# Patient Record
Sex: Female | Born: 1945 | Race: White | Hispanic: No | Smoking: Former smoker
Health system: Southern US, Community
[De-identification: ages and names within clinical notes are randomized; demographics above are authoritative.]

## PROBLEM LIST (undated history)

## (undated) DIAGNOSIS — C801 Malignant (primary) neoplasm, unspecified: Secondary | ICD-10-CM

## (undated) HISTORY — PX: ABDOMINAL HYSTERECTOMY: SHX81

## (undated) HISTORY — PX: BREAST LUMPECTOMY: SHX2

## (undated) HISTORY — PX: CHOLECYSTECTOMY: SHX55

## (undated) HISTORY — PX: KNEE SURGERY: SHX244

---

## 2015-10-27 ENCOUNTER — Other Ambulatory Visit: Payer: Self-pay | Admitting: Internal Medicine

## 2015-10-27 DIAGNOSIS — N6320 Unspecified lump in the left breast, unspecified quadrant: Secondary | ICD-10-CM

## 2015-11-03 ENCOUNTER — Other Ambulatory Visit: Payer: Self-pay | Admitting: Internal Medicine

## 2015-11-03 ENCOUNTER — Ambulatory Visit
Admission: RE | Admit: 2015-11-03 | Discharge: 2015-11-03 | Disposition: A | Discharge status: Home / Self Care | Payer: Medicare Other | Source: Ambulatory Visit | Attending: Internal Medicine | Admitting: Internal Medicine

## 2015-11-03 DIAGNOSIS — N6320 Unspecified lump in the left breast, unspecified quadrant: Secondary | ICD-10-CM

## 2016-01-24 ENCOUNTER — Encounter (HOSPITAL_BASED_OUTPATIENT_CLINIC_OR_DEPARTMENT_OTHER): Payer: Self-pay | Admitting: *Deleted

## 2016-01-24 ENCOUNTER — Emergency Department (HOSPITAL_BASED_OUTPATIENT_CLINIC_OR_DEPARTMENT_OTHER)
Admission: EM | Admit: 2016-01-24 | Discharge: 2016-01-24 | Disposition: A | Discharge status: Home / Self Care | Payer: Medicare Other | Attending: Emergency Medicine | Admitting: Emergency Medicine

## 2016-01-24 DIAGNOSIS — L509 Urticaria, unspecified: Secondary | ICD-10-CM | POA: Diagnosis not present

## 2016-01-24 DIAGNOSIS — Z79899 Other long term (current) drug therapy: Secondary | ICD-10-CM | POA: Diagnosis not present

## 2016-01-24 DIAGNOSIS — L299 Pruritus, unspecified: Secondary | ICD-10-CM | POA: Diagnosis present

## 2016-01-24 DIAGNOSIS — Z87891 Personal history of nicotine dependence: Secondary | ICD-10-CM | POA: Diagnosis not present

## 2016-01-24 HISTORY — DX: Malignant (primary) neoplasm, unspecified: C80.1

## 2016-01-24 MED ORDER — LORATADINE 10 MG PO TABS
10.0000 mg | ORAL_TABLET | Freq: Every day | ORAL | Status: DC
  Administered 2016-01-24: 10 mg via ORAL
  Filled 2016-01-24: qty 1

## 2016-01-24 MED ORDER — PREDNISONE 10 MG PO TABS
60.0000 mg | ORAL_TABLET | Freq: Once | ORAL | Status: AC
  Administered 2016-01-24: 60 mg via ORAL
  Filled 2016-01-24: qty 1

## 2016-01-24 MED ORDER — PREDNISONE 20 MG PO TABS: 20.0000 mg | ORAL_TABLET | Freq: Every day | ORAL | Status: AC

## 2016-01-24 MED ORDER — HYDROXYZINE HCL 25 MG PO TABS: 25.0000 mg | ORAL_TABLET | Freq: Four times a day (QID) | ORAL | Status: AC

## 2016-01-24 MED ORDER — CETIRIZINE HCL 10 MG PO TABS: 10.0000 mg | ORAL_TABLET | Freq: Every day | ORAL | Status: AC

## 2016-01-24 MED ORDER — HYDROXYZINE HCL 25 MG PO TABS
25.0000 mg | ORAL_TABLET | Freq: Once | ORAL | Status: AC
  Administered 2016-01-24: 25 mg via ORAL
  Filled 2016-01-24: qty 1

## 2016-01-24 NOTE — ED Notes (Signed)
Patient c/o hives & itching that started yesterday afternoon on her right hand and has spread to her upper and lower body. She took benadryl around 2am.

## 2016-01-24 NOTE — ED Provider Notes (Signed)
CSN: [id number]     Arrival date & time 01/24/16  0806 History   First MD Initiated Contact with Patient 01/24/16 [phone]     Chief Complaint  Patient presents with  . Urticaria     HPI  Patient presents evaluation of "hives". Started feeling some itching on her hands and back of her neck yesterday noticed some erythema and elevation of the skin. This is now spread to under her breasts stomach and back. No difficult breathing. No difficulty swallowing or speech. Not dizzy or lightheaded. No GI complaints. No obvious antigen. Is on Cytoxan, and Taxol for breast cancer. Severe second and fusion 2 weeks ago. Her first infusion was 5 weeks ago and did not develop rash. No new medications or foods or garments for her. No history of urticaria.  Past Medical History  Diagnosis Date  . Cancer [facility])     breast   Past Surgical History  Procedure Laterality Date  . Cholecystectomy    . Knee surgery Bilateral   . Breast lumpectomy Left   . Abdominal hysterectomy     No family history on file. Social History  Substance Use Topics  . Smoking status: Former Research scientist (life sciences)  . Smokeless tobacco: None  . Alcohol Use: No   OB History    No data available     Review of Systems  Constitutional: Negative for fever, chills, diaphoresis, appetite change and fatigue.  HENT: Negative for mouth sores, sore throat and trouble swallowing.   Eyes: Negative for visual disturbance.  Respiratory: Negative for cough, chest tightness, shortness of breath and wheezing.   Cardiovascular: Negative for chest pain.  Gastrointestinal: Negative for nausea, vomiting, abdominal pain, diarrhea and abdominal distention.  Endocrine: Negative for polydipsia, polyphagia and polyuria.  Genitourinary: Negative for dysuria, frequency and hematuria.  Musculoskeletal: Negative for gait problem.  Skin: Positive for rash. Negative for color change and pallor.  Neurological: Negative for dizziness, syncope, light-headedness and  headaches.  Hematological: Does not bruise/bleed easily.  Psychiatric/Behavioral: Negative for behavioral problems and confusion.      Allergies  Review of patient's allergies indicates no known allergies.  Home Medications   Prior to Admission medications   Medication Sig Start Date End Date Taking? Authorizing Provider  colesevelam (WELCHOL) 625 MG tablet Take by mouth 2 (two) times daily with a meal.   Yes Historical Provider, MD  cetirizine (ZYRTEC) 10 MG tablet Take 1 tablet (10 mg total) by mouth daily. 1 po q day prn allergies 01/24/16   Tanna Furry, MD  hydrOXYzine (ATARAX/VISTARIL) 25 MG tablet Take 1 tablet (25 mg total) by mouth every 6 (six) hours. 01/24/16   Tanna Furry, MD  predniSONE (DELTASONE) 20 MG tablet Take 1 tablet (20 mg total) by mouth daily with breakfast. 01/24/16   Tanna Furry, MD   BP 149/97 mmHg  Pulse 106  Temp(Src) 98.8 F (37.1 C) (Oral)  Resp 18  Ht 5\' 2"  (1.575 m)  Wt 235 lb (106.595 kg)  BMI 42.97 kg/m2  SpO2 98% Physical Exam  Constitutional: She is oriented to person, place, and time. She appears well-developed and well-nourished. No distress.  HENT:  Head: Normocephalic.  Posterior drinks benign. Uvula midline. No stridor. Clear lungs.  Eyes: Conjunctivae are normal. Pupils are equal, round, and reactive to light. No scleral icterus.  Neck: Normal range of motion. Neck supple. No thyromegaly present.  Cardiovascular: Normal rate and regular rhythm.  Exam reveals no gallop and no friction rub.   No murmur heard. Pulmonary/Chest:  Effort normal and breath sounds normal. No respiratory distress. She has no wheezes. She has no rales.  Abdominal: Soft. Bowel sounds are normal. She exhibits no distension. There is no tenderness. There is no rebound.  Musculoskeletal: Normal range of motion.  Neurological: She is alert and oriented to person, place, and time.  Skin: Skin is warm and dry. Rash noted.  Psychiatric: She has a normal mood and affect. Her  behavior is normal.    ED Course  Procedures (including critical care time) Labs Review Labs Reviewed - No data to display  Imaging Review No results found. I have personally reviewed and evaluated these images and lab results as part of my medical decision-making.   EKG Interpretation None      MDM   Final diagnoses:  Urticaria    Dermatologic only reaction. No symptoms of GI reaction. Normal throat clear lungs no sensation of swelling in the throat or difficult breathing. Normotensive. Urticaria without anaphylaxis. No indication for epinephrine. Plan will be antihistamines prednisone. No clear known antigen from her history. Primary care follow-up.    Tanna Furry, MD 01/24/16 1019

## 2016-01-24 NOTE — ED Notes (Signed)
MD with pt  

## 2016-01-24 NOTE — Discharge Instructions (Signed)
Hives Hives are itchy, red, swollen areas of the skin. They can vary in size and location on your body. Hives can come and go for hours or several days (acute hives) or for several weeks (chronic hives). Hives do not spread from person to person (noncontagious). They may get worse with scratching, exercise, and emotional stress. CAUSES   Allergic reaction to food, additives, or drugs.  Infections, including the common cold.  Illness, such as vasculitis, lupus, or thyroid disease.  Exposure to sunlight, heat, or cold.  Exercise.  Stress.  Contact with chemicals. SYMPTOMS   Red or white swollen patches on the skin. The patches may change size, shape, and location quickly and repeatedly.  Itching.  Swelling of the hands, feet, and face. This may occur if hives develop deeper in the skin. DIAGNOSIS  Your caregiver can usually tell what is wrong by performing a physical exam. Skin or blood tests may also be done to determine the cause of your hives. In some cases, the cause cannot be determined. TREATMENT  Mild cases usually get better with medicines such as antihistamines. Severe cases may require an emergency epinephrine injection. If the cause of your hives is known, treatment includes avoiding that trigger.  HOME CARE INSTRUCTIONS   Avoid causes that trigger your hives.  Take antihistamines as directed by your caregiver to reduce the severity of your hives. Non-sedating or low-sedating antihistamines are usually recommended. Do not drive while taking an antihistamine.  Take any other medicines prescribed for itching as directed by your caregiver.  Wear loose-fitting clothing.  Keep all follow-up appointments as directed by your caregiver. SEEK MEDICAL CARE IF:   You have persistent or severe itching that is not relieved with medicine.  You have painful or swollen joints. SEEK IMMEDIATE MEDICAL CARE IF:   You have a fever.  Your tongue or lips are swollen.  You have  trouble breathing or swallowing.  You feel tightness in the throat or chest.  You have abdominal pain. These problems may be the first sign of a life-threatening allergic reaction. Call your local emergency services (911 in [place] MAKE SURE YOU:   Understand these instructions.  Will watch your condition.  Will get help right away if you are not doing well or get worse.   This information is not intended to replace advice given to you by your health care provider. Make sure you discuss any questions you have with your health care provider.   Document Released: 08/22/2005 Document Revised: 08/27/2013 Document Reviewed: 11/15/2011 Elsevier Interactive Patient Education 2016 [employer]  

## 2016-01-25 ENCOUNTER — Encounter (HOSPITAL_BASED_OUTPATIENT_CLINIC_OR_DEPARTMENT_OTHER): Payer: Self-pay | Admitting: *Deleted

## 2016-01-25 DIAGNOSIS — L509 Urticaria, unspecified: Secondary | ICD-10-CM | POA: Diagnosis not present

## 2016-01-25 DIAGNOSIS — Z853 Personal history of malignant neoplasm of breast: Secondary | ICD-10-CM | POA: Insufficient documentation

## 2016-01-25 DIAGNOSIS — Z87891 Personal history of nicotine dependence: Secondary | ICD-10-CM | POA: Insufficient documentation

## 2016-01-25 DIAGNOSIS — T7840XA Allergy, unspecified, initial encounter: Secondary | ICD-10-CM | POA: Diagnosis present

## 2016-01-25 NOTE — ED Notes (Signed)
Hives.  Was seen in ed last night for same.  States that the hives have gotten worse.  Denies respiratory involvement.

## 2016-01-26 ENCOUNTER — Emergency Department (HOSPITAL_BASED_OUTPATIENT_CLINIC_OR_DEPARTMENT_OTHER)
Admission: EM | Admit: 2016-01-26 | Discharge: 2016-01-26 | Disposition: A | Discharge status: Home / Self Care | Payer: Medicare Other | Attending: Emergency Medicine | Admitting: Emergency Medicine

## 2016-01-26 DIAGNOSIS — L509 Urticaria, unspecified: Secondary | ICD-10-CM

## 2016-01-26 MED ORDER — DIPHENHYDRAMINE HCL 50 MG/ML IJ SOLN
50.0000 mg | Freq: Once | INTRAMUSCULAR | Status: AC
  Administered 2016-01-26: 50 mg via INTRAVENOUS
  Filled 2016-01-26: qty 1

## 2016-01-26 MED ORDER — FAMOTIDINE IN NACL 20-0.9 MG/50ML-% IV SOLN
20.0000 mg | Freq: Once | INTRAVENOUS | Status: AC
  Administered 2016-01-26: 20 mg via INTRAVENOUS
  Filled 2016-01-26: qty 50

## 2016-01-26 MED ORDER — DIPHENHYDRAMINE HCL 50 MG/ML IJ SOLN
25.0000 mg | Freq: Once | INTRAMUSCULAR | Status: AC
  Administered 2016-01-26: 25 mg via INTRAVENOUS
  Filled 2016-01-26: qty 1

## 2016-01-26 NOTE — ED Provider Notes (Signed)
CSN: [id number]     Arrival date & time 01/25/16  2144 History   First MD Initiated Contact with Patient 01/26/16 0239     Chief Complaint  Patient presents with  . Allergic Reaction     (Consider location/radiation/quality/duration/timing/severity/associated sxs/prior Treatment) HPI  This is a 70 year old female undergoing chemotherapy for breast cancer. Her last treatment was 2 weeks ago. She was seen here yesterday for generalized urticaria. She was started on prednisone and hydroxyzine. She has been taking her medications as instructed but her urticaria has worsened. It is very pruritic. She denies throat swelling, difficulty breathing, nausea, vomiting or diarrhea. She thinks it may of been triggered by eating cherries.  Past Medical History  Diagnosis Date  . Cancer [facility])     breast   Past Surgical History  Procedure Laterality Date  . Cholecystectomy    . Knee surgery Bilateral   . Breast lumpectomy Left   . Abdominal hysterectomy     History reviewed. No pertinent family history. Social History  Substance Use Topics  . Smoking status: Former Research scientist (life sciences)  . Smokeless tobacco: None  . Alcohol Use: No   OB History    No data available     Review of Systems  All other systems reviewed and are negative.   Allergies  Review of patient's allergies indicates no known allergies.  Home Medications   Prior to Admission medications   Medication Sig Start Date End Date Taking? Authorizing Provider  cetirizine (ZYRTEC) 10 MG tablet Take 1 tablet (10 mg total) by mouth daily. 1 po q day prn allergies 01/24/16   Tanna Furry, MD  colesevelam [facility]) 625 MG tablet Take by mouth 2 (two) times daily with a meal.    Historical Provider, MD  hydrOXYzine (ATARAX/VISTARIL) 25 MG tablet Take 1 tablet (25 mg total) by mouth every 6 (six) hours. 01/24/16   Tanna Furry, MD  predniSONE (DELTASONE) 20 MG tablet Take 1 tablet (20 mg total) by mouth daily with breakfast. 01/24/16   Tanna Furry, MD    BP 150/82 mmHg  Pulse 121  Temp(Src) 98.9 F (37.2 C) (Oral)  Resp 18  Ht 5\' 2"  (1.575 m)  Wt 235 lb (106.595 kg)  BMI 42.97 kg/m2  SpO2 100%  LMP 01/25/2016   Physical Exam  General: Well-developed, well-nourished female in no acute distress; appearance consistent with age of record HENT: normocephalic; atraumatic; pharynx normal; no dysphonia Eyes: pupils equal, round and reactive to light; extraocular muscles intact Neck: supple Heart: regular rate and rhythm Lungs: clear to auscultation bilaterally Abdomen: soft; nondistended; nontender; bowel sounds present Extremities: No deformity; full range of motion; pulses normal Neurologic: Awake, alert and oriented; motor function intact in all extremities and symmetric; no facial droop Skin: Warm and dry; alopecia; generalized urticaria most prominent on the extremities Psychiatric: Normal mood and affect    ED Course  Procedures (including critical care time)   MDM  5:06 AM Patient feeling better, urticaria partially resolved after Benadryl 75 milligrams IV and Pepcid 20 milligrams IV. Patient is awake and alert. Patient was advised to try 75 milligrams of Benadryl every 6 hours as an alternative to hydroxyzine as it appears to have better results.  Shanon Rosser, MD 01/26/16 [phone]

## 2016-01-26 NOTE — ED Notes (Signed)
C/o whelps over body  Was seen here last pm for same  States meds not helping and it is getting worse,  No diff breathing or talking

## 2016-01-26 NOTE — Discharge Instructions (Signed)
Hives Hives are itchy, red, swollen areas of the skin. They can vary in size and location on your body. Hives can come and go for hours or several days (acute hives) or for several weeks (chronic hives). Hives do not spread from person to person (noncontagious). They may get worse with scratching, exercise, and emotional stress. CAUSES   Allergic reaction to food, additives, or drugs.  Infections, including the common cold.  Illness, such as vasculitis, lupus, or thyroid disease.  Exposure to sunlight, heat, or cold.  Exercise.  Stress.  Contact with chemicals. SYMPTOMS   Red or white swollen patches on the skin. The patches may change size, shape, and location quickly and repeatedly.  Itching.  Swelling of the hands, feet, and face. This may occur if hives develop deeper in the skin. DIAGNOSIS  Your caregiver can usually tell what is wrong by performing a physical exam. Skin or blood tests may also be done to determine the cause of your hives. In some cases, the cause cannot be determined. TREATMENT  Mild cases usually get better with medicines such as antihistamines. Severe cases may require an emergency epinephrine injection. If the cause of your hives is known, treatment includes avoiding that trigger.  HOME CARE INSTRUCTIONS   Avoid causes that trigger your hives.  Take antihistamines as directed by your caregiver to reduce the severity of your hives. Non-sedating or low-sedating antihistamines are usually recommended. Do not drive while taking an antihistamine.  Take any other medicines prescribed for itching as directed by your caregiver.  Wear loose-fitting clothing.  Keep all follow-up appointments as directed by your caregiver. SEEK MEDICAL CARE IF:   You have persistent or severe itching that is not relieved with medicine.  You have painful or swollen joints. SEEK IMMEDIATE MEDICAL CARE IF:   You have a fever.  Your tongue or lips are swollen.  You have  trouble breathing or swallowing.  You feel tightness in the throat or chest.  You have abdominal pain. These problems may be the first sign of a life-threatening allergic reaction. Call your local emergency services (911 in [place] MAKE SURE YOU:   Understand these instructions.  Will watch your condition.  Will get help right away if you are not doing well or get worse.   This information is not intended to replace advice given to you by your health care provider. Make sure you discuss any questions you have with your health care provider.   Document Released: 08/22/2005 Document Revised: 08/27/2013 Document Reviewed: 11/15/2011 Elsevier Interactive Patient Education 2016 [employer]  

## 2017-03-30 IMAGING — MG MM DIAG BREAST TOMO UNI LEFT
8 series · 8 of 24 positions shown · non-contrast
Comparison: Previous exam(s).

CLINICAL DATA: Biopsy was performed of 2 areas in the left breast,
of small mass is described on recent diagnostic mammogram and
ultrasound of 10/27/2015.

EXAM:
DIAGNOSTIC LEFT MAMMOGRAM POST STEREOTACTIC BIOPSY

[L CC (1 of 2)]
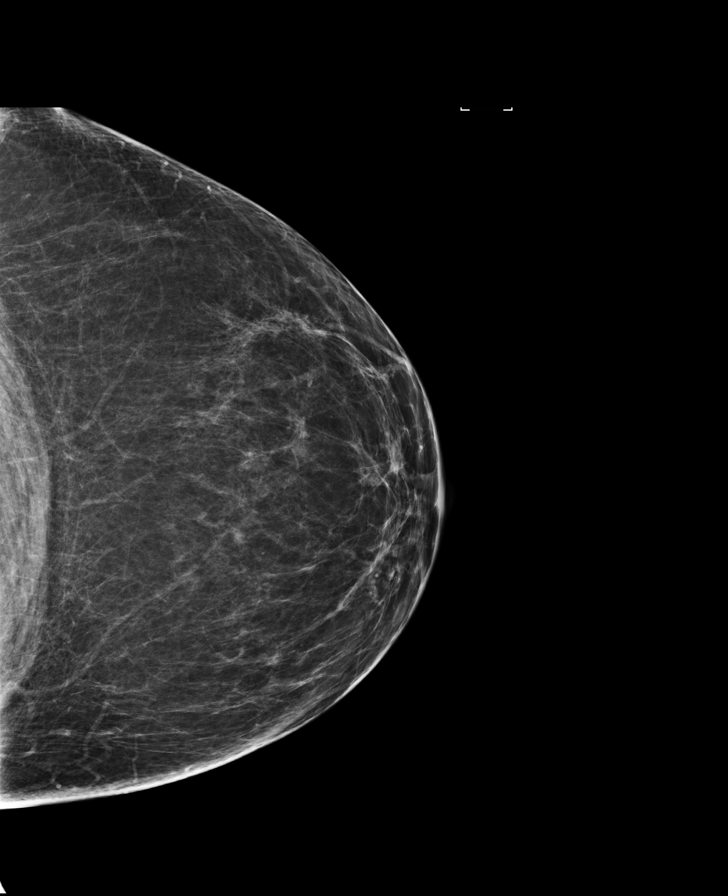

[L CC (2 of 2)]
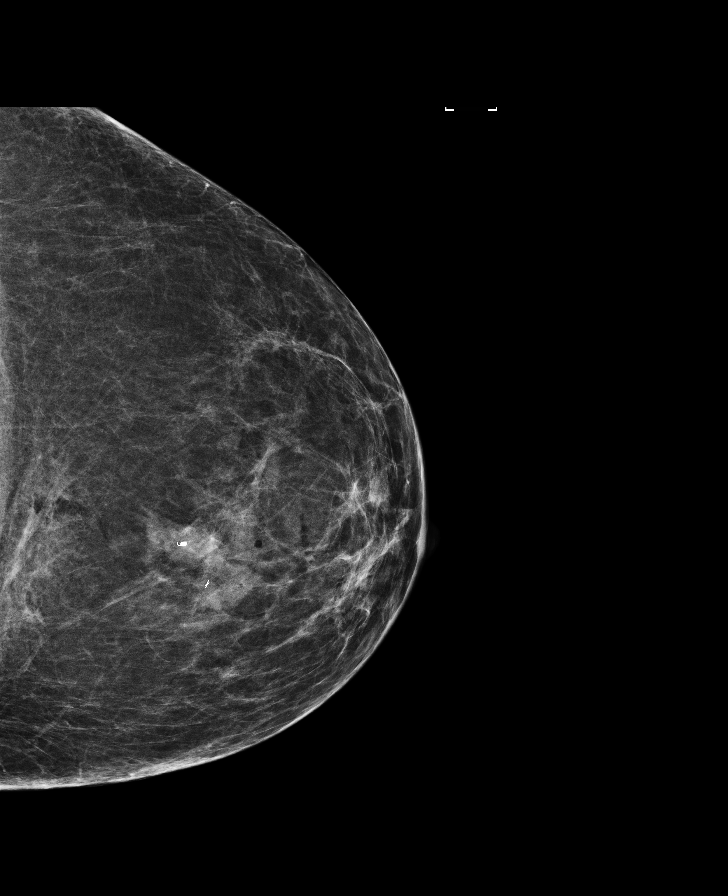

[L LM]
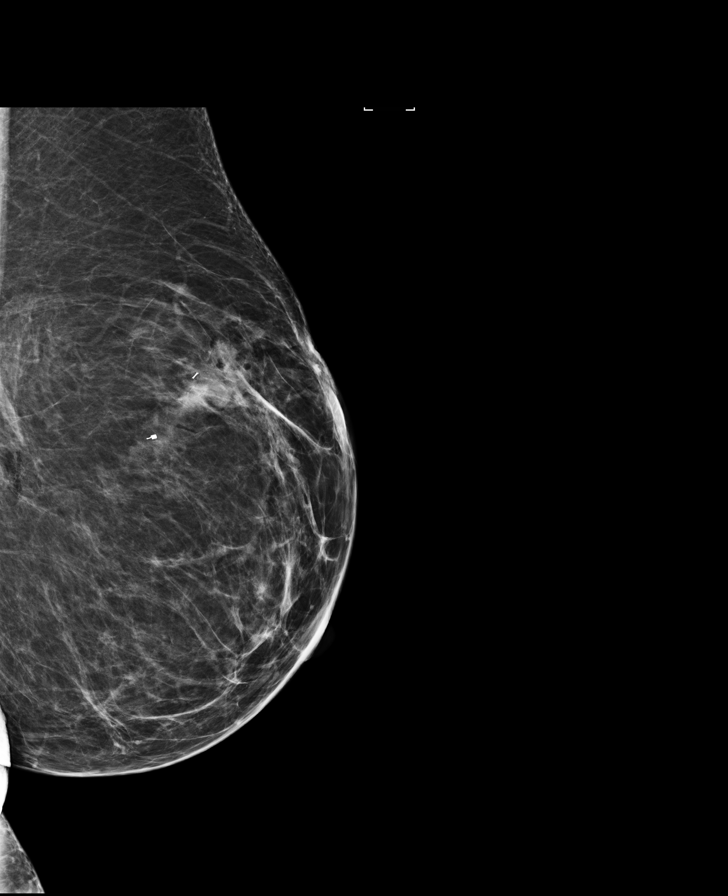

[L MLO]
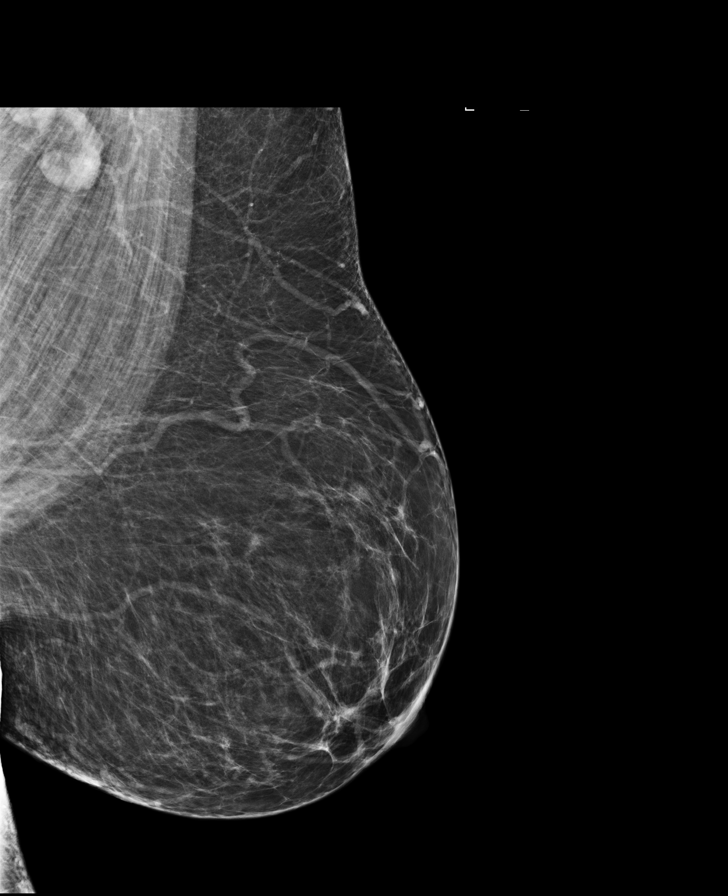

[L MLO tomo · tomo slice 49/97.0]
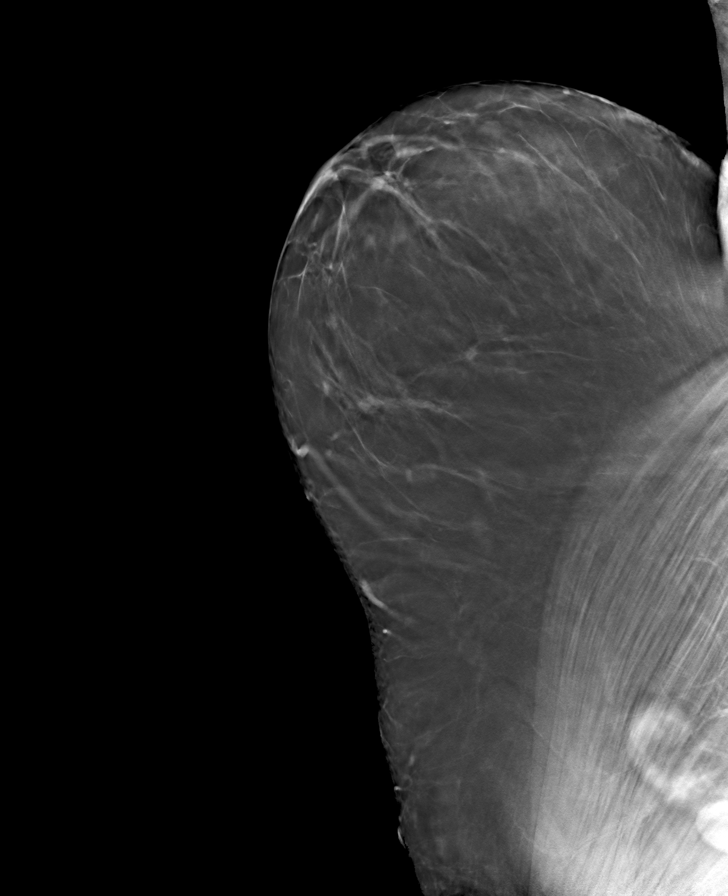

[L CC tomo (1 of 2) · tomo slice 43/84.0]
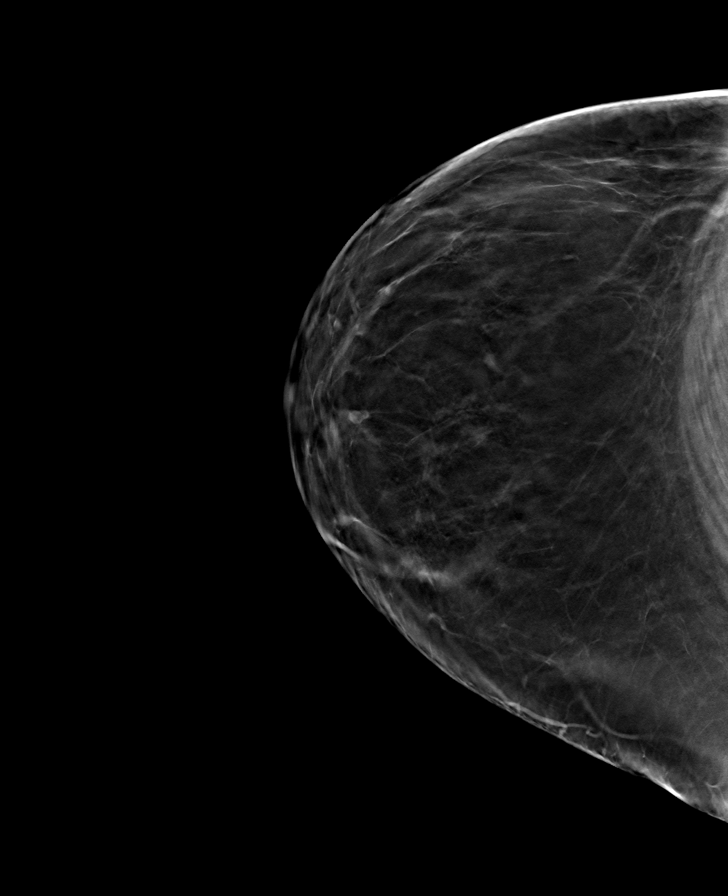

[L CC tomo (2 of 2) · tomo slice 43/86.0]
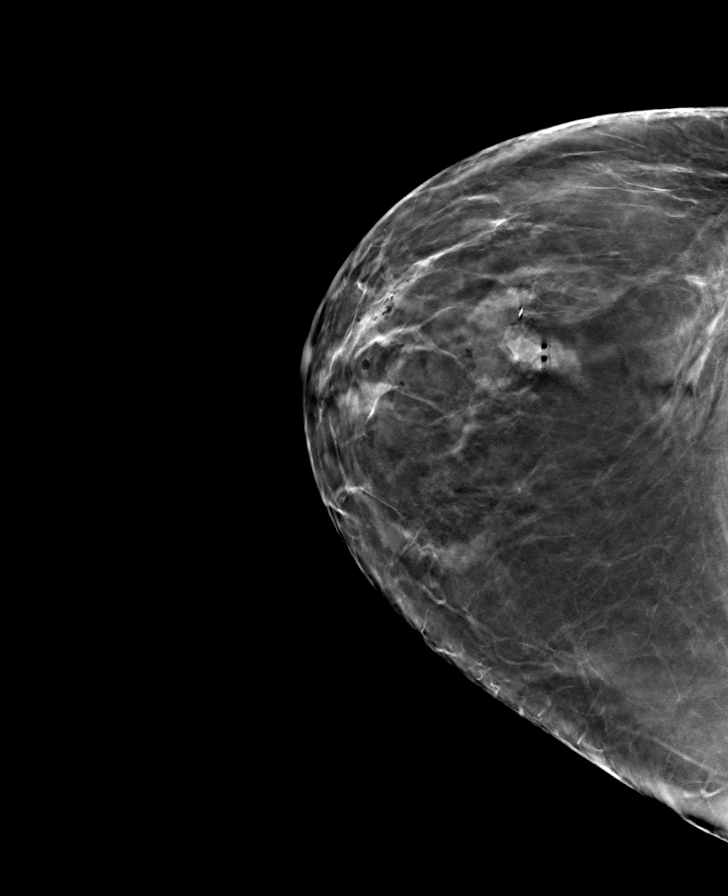

[L LM tomo · tomo slice 44/87.0]
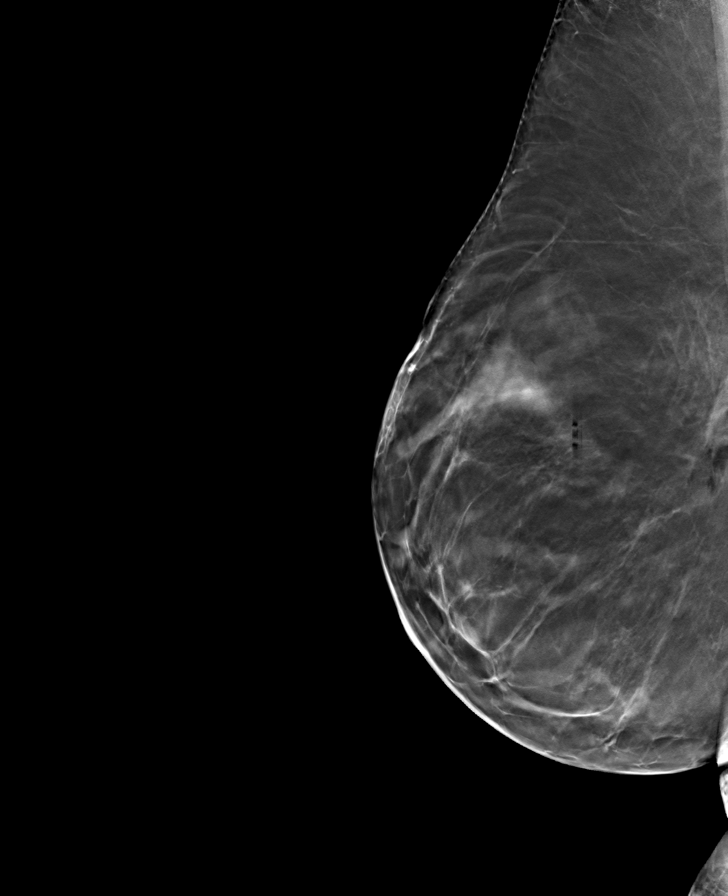

[8 of 24 positions shown; findings below may reference images not displayed]

FINDINGS: Mammographic images were obtained following stereotactic guided
biopsy of a small mass in the upper slightly inner left breast,
middle third. An coil shaped biopsy clip is satisfactorily
positioned in the upper central/slightly medial left breast. There
are biopsy changes at the site of the coil shaped biopsy clip,
making visualization of the small mass on the post clip mammograms
difficult. The biopsy clip is in the expected location of the
intended biopsy.

An X shaped biopsy clip was placed at the second stereotactic biopsy
site performed from a lateral to medial approach of a small mass in
the superior left breast, more anteriorly positioned than the first
biopsy mass. This mass was suspected to be in the 12 o'clock
position on the recent diagnostic workup. The X shaped biopsy clip
is in the expected location of the small mass on the 90 degree
lateral view, but appears slightly more medial in position than the
expected position of the mass on the recent diagnostic CC view. Due
to post biopsy changes and small hematoma, the small mass is not
well visualized. The biopsy clip may have migrated slightly
medially.
IMPRESSION: Satisfactory position of coil shaped biopsy clip in the expected
location of the [DATE] position mass.

X shaped biopsy clip appears appropriately positioned on the 90
degree lateral view, and may be migrated slightly medially on the CC
view.

Final Assessment: Post Procedure Mammograms for Marker Placement

## 2019-10-05 ENCOUNTER — Ambulatory Visit: Payer: Medicare Other

## 2019-10-16 ENCOUNTER — Ambulatory Visit: Payer: Medicare Other

## 2023-05-31 ENCOUNTER — Other Ambulatory Visit (HOSPITAL_BASED_OUTPATIENT_CLINIC_OR_DEPARTMENT_OTHER): Payer: Self-pay

## 2023-06-02 ENCOUNTER — Other Ambulatory Visit (HOSPITAL_BASED_OUTPATIENT_CLINIC_OR_DEPARTMENT_OTHER): Payer: Self-pay

## 2023-06-06 ENCOUNTER — Other Ambulatory Visit (HOSPITAL_BASED_OUTPATIENT_CLINIC_OR_DEPARTMENT_OTHER): Payer: Self-pay

## 2023-06-06 MED ORDER — WEGOVY 0.25 MG/0.5ML ~~LOC~~ SOAJ
0.2500 mg | SUBCUTANEOUS | 5 refills | Status: AC
  Filled 2023-06-06: qty 2, 28d supply, fill #0
  Filled 2023-06-19: qty 2, 28d supply, fill #1

## 2023-06-19 ENCOUNTER — Other Ambulatory Visit (HOSPITAL_BASED_OUTPATIENT_CLINIC_OR_DEPARTMENT_OTHER): Payer: Self-pay

## 2023-06-19 MED ORDER — WEGOVY 0.5 MG/0.5ML ~~LOC~~ SOAJ
0.5000 mg | SUBCUTANEOUS | 1 refills | Status: DC
  Filled 2023-06-19: qty 2, 28d supply, fill #0
  Filled 2023-07-24: qty 2, 28d supply, fill #1

## 2023-07-24 ENCOUNTER — Other Ambulatory Visit (HOSPITAL_BASED_OUTPATIENT_CLINIC_OR_DEPARTMENT_OTHER): Payer: Self-pay

## 2023-08-10 ENCOUNTER — Other Ambulatory Visit (HOSPITAL_BASED_OUTPATIENT_CLINIC_OR_DEPARTMENT_OTHER): Payer: Self-pay

## 2023-08-10 MED ORDER — WEGOVY 1 MG/0.5ML ~~LOC~~ SOAJ
1.0000 mg | SUBCUTANEOUS | 0 refills | Status: AC
  Filled 2023-08-10: qty 6, 84d supply, fill #0
  Filled 2023-08-15: qty 2, 28d supply, fill #0
  Filled 2023-09-16: qty 2, 28d supply, fill #1
  Filled 2023-10-15: qty 2, 28d supply, fill #2

## 2023-08-15 ENCOUNTER — Other Ambulatory Visit (HOSPITAL_BASED_OUTPATIENT_CLINIC_OR_DEPARTMENT_OTHER): Payer: Self-pay

## 2023-09-18 ENCOUNTER — Other Ambulatory Visit (HOSPITAL_BASED_OUTPATIENT_CLINIC_OR_DEPARTMENT_OTHER): Payer: Self-pay

## 2023-10-15 ENCOUNTER — Other Ambulatory Visit (HOSPITAL_BASED_OUTPATIENT_CLINIC_OR_DEPARTMENT_OTHER): Payer: Self-pay

## 2023-10-17 ENCOUNTER — Other Ambulatory Visit (HOSPITAL_BASED_OUTPATIENT_CLINIC_OR_DEPARTMENT_OTHER): Payer: Self-pay

## 2023-10-17 MED ORDER — WEGOVY 1.7 MG/0.75ML ~~LOC~~ SOAJ
1.7000 mg | SUBCUTANEOUS | 3 refills | Status: DC
  Filled 2023-10-17: qty 3, 28d supply, fill #0
  Filled 2023-12-01: qty 3, 28d supply, fill #1
  Filled 2024-01-02: qty 3, 28d supply, fill #2
  Filled 2024-01-25: qty 3, 28d supply, fill #3
  Filled 2024-02-29: qty 3, 28d supply, fill #4
  Filled 2024-07-02: qty 3, 28d supply, fill #5
  Filled 2024-07-30: qty 3, 28d supply, fill #6
  Filled 2024-08-27: qty 3, 28d supply, fill #7

## 2023-12-01 ENCOUNTER — Other Ambulatory Visit (HOSPITAL_BASED_OUTPATIENT_CLINIC_OR_DEPARTMENT_OTHER): Payer: Self-pay

## 2024-01-02 ENCOUNTER — Other Ambulatory Visit (HOSPITAL_BASED_OUTPATIENT_CLINIC_OR_DEPARTMENT_OTHER): Payer: Self-pay

## 2024-04-04 ENCOUNTER — Other Ambulatory Visit: Payer: Self-pay

## 2024-04-04 ENCOUNTER — Other Ambulatory Visit (HOSPITAL_BASED_OUTPATIENT_CLINIC_OR_DEPARTMENT_OTHER): Payer: Self-pay

## 2024-04-04 MED ORDER — WEGOVY 1.7 MG/0.75ML ~~LOC~~ SOAJ
1.7000 mg | SUBCUTANEOUS | 3 refills | Status: DC
  Filled 2024-04-04: qty 3, 28d supply, fill #0
  Filled 2024-04-16 – 2024-05-07 (×2): qty 3, 28d supply, fill #1
  Filled 2024-05-23 – 2024-05-30 (×2): qty 3, 28d supply, fill #2
  Filled 2024-07-30: qty 3, 28d supply, fill #3

## 2024-04-16 ENCOUNTER — Other Ambulatory Visit (HOSPITAL_BASED_OUTPATIENT_CLINIC_OR_DEPARTMENT_OTHER): Payer: Self-pay

## 2024-05-07 ENCOUNTER — Other Ambulatory Visit (HOSPITAL_BASED_OUTPATIENT_CLINIC_OR_DEPARTMENT_OTHER): Payer: Self-pay

## 2024-05-23 ENCOUNTER — Other Ambulatory Visit (HOSPITAL_BASED_OUTPATIENT_CLINIC_OR_DEPARTMENT_OTHER): Payer: Self-pay

## 2024-05-24 ENCOUNTER — Other Ambulatory Visit (HOSPITAL_BASED_OUTPATIENT_CLINIC_OR_DEPARTMENT_OTHER): Payer: Self-pay

## 2024-07-02 ENCOUNTER — Other Ambulatory Visit (HOSPITAL_BASED_OUTPATIENT_CLINIC_OR_DEPARTMENT_OTHER): Payer: Self-pay

## 2024-07-30 ENCOUNTER — Other Ambulatory Visit: Payer: Self-pay

## 2024-07-30 ENCOUNTER — Other Ambulatory Visit (HOSPITAL_BASED_OUTPATIENT_CLINIC_OR_DEPARTMENT_OTHER): Payer: Self-pay

## 2024-09-02 ENCOUNTER — Other Ambulatory Visit (HOSPITAL_BASED_OUTPATIENT_CLINIC_OR_DEPARTMENT_OTHER): Payer: Self-pay

## 2024-09-02 MED ORDER — WEGOVY 2.4 MG/0.75ML ~~LOC~~ SOAJ
2.4000 mg | SUBCUTANEOUS | 3 refills | Status: AC
  Filled 2024-09-02: qty 3, 28d supply, fill #0
  Filled 2024-09-27: qty 3, 28d supply, fill #1

## 2024-09-27 ENCOUNTER — Other Ambulatory Visit (HOSPITAL_BASED_OUTPATIENT_CLINIC_OR_DEPARTMENT_OTHER): Payer: Self-pay
